# Patient Record
Sex: Female | Born: 1965 | Race: White | Hispanic: No | Marital: Married | Smoking: Former smoker
Health system: Southern US, Community
[De-identification: ages and names within clinical notes are randomized; demographics above are authoritative.]

## PROBLEM LIST (undated history)

## (undated) HISTORY — PX: EAR CYST EXCISION: SHX22

## (undated) HISTORY — PX: UMBILICAL HERNIA REPAIR: SHX196

## (undated) HISTORY — PX: TUBAL LIGATION: SHX77

---

## 2014-03-24 ENCOUNTER — Other Ambulatory Visit: Payer: Self-pay | Admitting: Family Medicine

## 2014-03-24 ENCOUNTER — Ambulatory Visit (INDEPENDENT_AMBULATORY_CARE_PROVIDER_SITE_OTHER): Payer: 59

## 2014-03-24 DIAGNOSIS — M542 Cervicalgia: Secondary | ICD-10-CM

## 2015-03-09 IMAGING — CR DG CERVICAL SPINE COMPLETE 4+V
5 series · 5 of 5 positions shown · non-contrast
Comparison: None.

CLINICAL DATA: Two week history of posterior neck burning sensation
with radicular symptoms and both shoulder regions.

EXAM:
CERVICAL SPINE  4+ VIEWS

[view not recorded (1 of 5)]
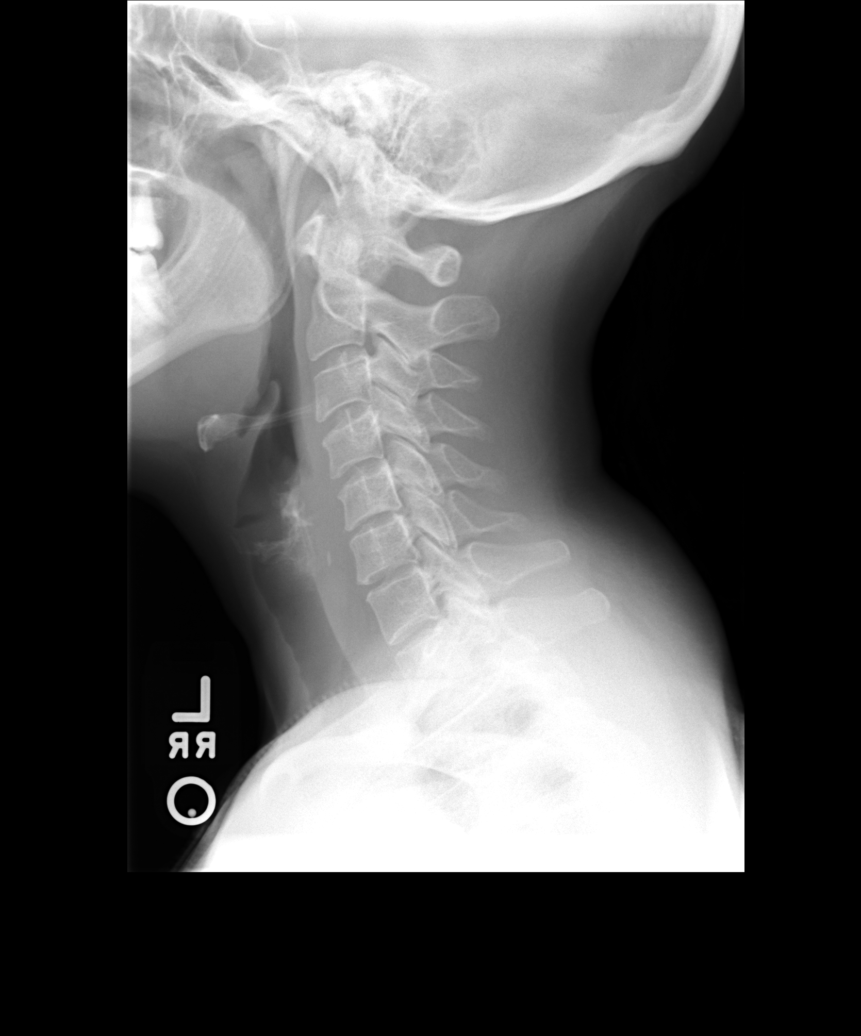

[view not recorded (2 of 5)]
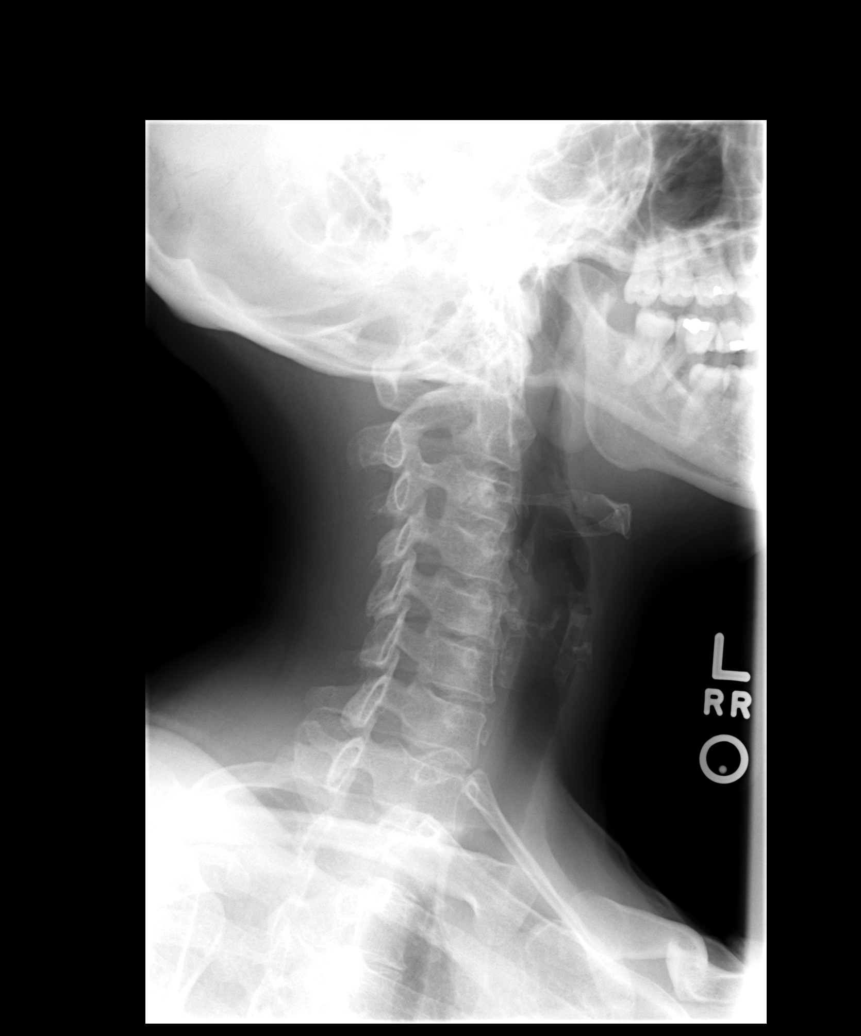

[view not recorded (3 of 5)]
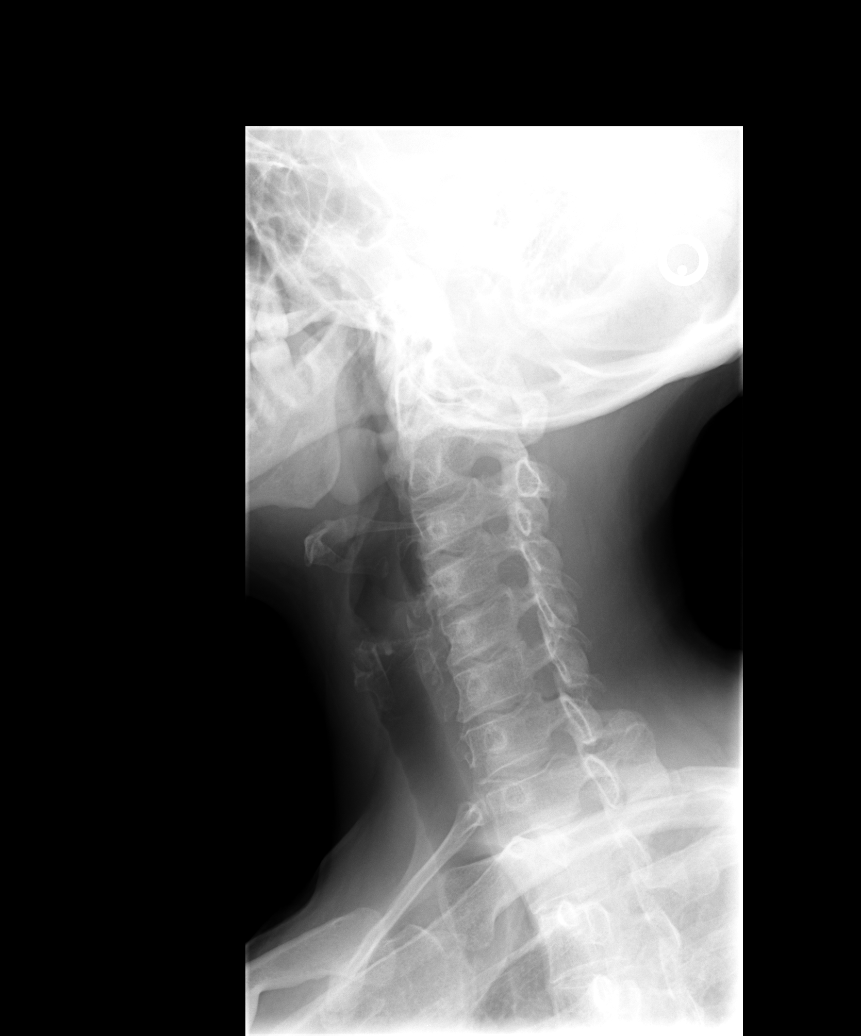

[view not recorded (4 of 5)]
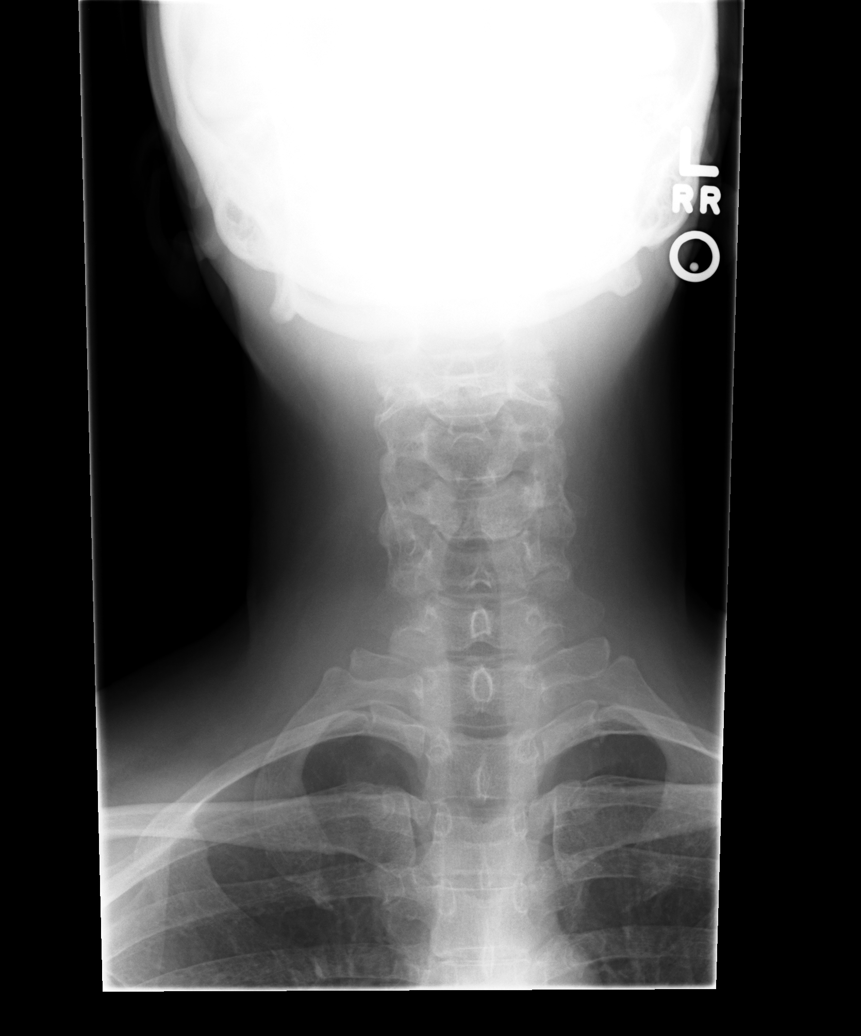

[view not recorded (5 of 5)]
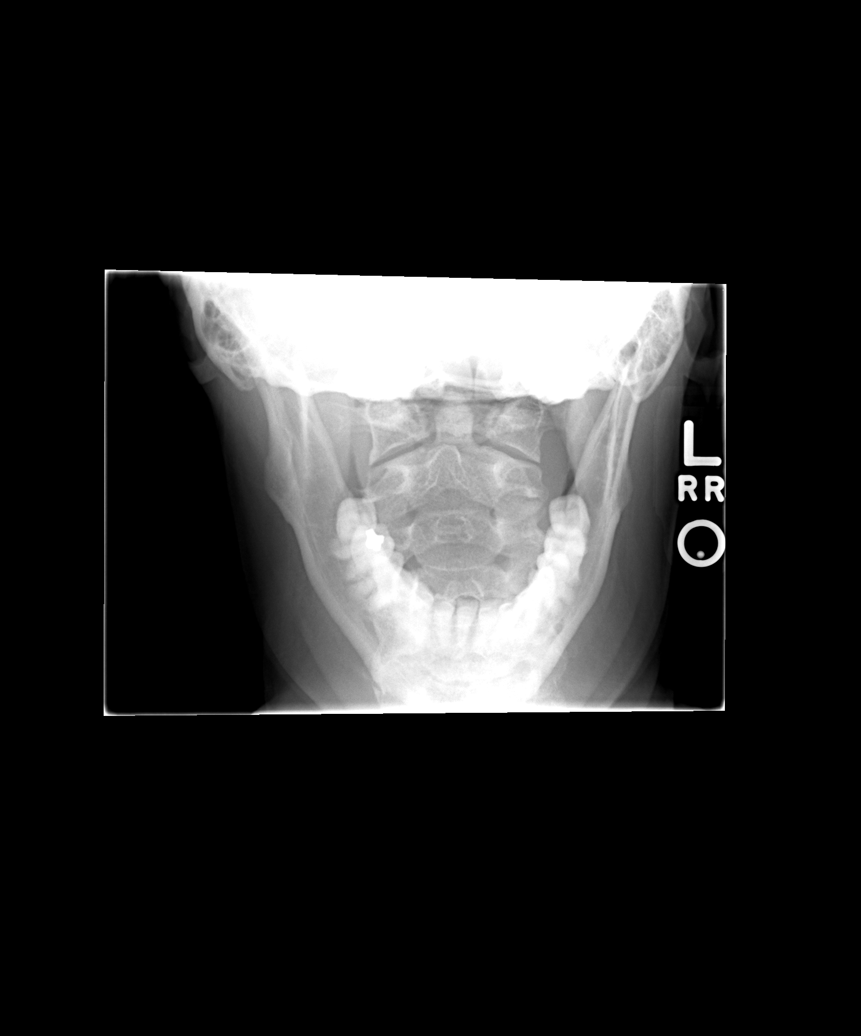

[5 of 5 positions shown; findings below may reference images not displayed]

FINDINGS: Frontal, lateral, open-mouth odontoid, and bilateral oblique views
were obtained. There is no fracture or spondylolisthesis.
Prevertebral soft tissues and predental space regions are normal.
There is moderate disc space narrowing at C5-6 and C6-7. There is
slightly milder narrowing at C4-5. There is no appreciable exit
foraminal narrowing on the oblique views. No erosive change.
IMPRESSION: Osteoarthritic changes several levels. No fracture or
spondylolisthesis.

## 2020-11-10 ENCOUNTER — Encounter: Payer: Self-pay | Admitting: Emergency Medicine

## 2020-11-10 ENCOUNTER — Emergency Department (INDEPENDENT_AMBULATORY_CARE_PROVIDER_SITE_OTHER): Payer: Self-pay

## 2020-11-10 ENCOUNTER — Emergency Department
Admission: EM | Admit: 2020-11-10 | Discharge: 2020-11-10 | Disposition: A | Discharge status: Home / Self Care | Payer: Self-pay | Source: Home / Self Care

## 2020-11-10 ENCOUNTER — Other Ambulatory Visit: Payer: Self-pay

## 2020-11-10 DIAGNOSIS — M25511 Pain in right shoulder: Secondary | ICD-10-CM

## 2020-11-10 DIAGNOSIS — W19XXXA Unspecified fall, initial encounter: Secondary | ICD-10-CM

## 2020-11-10 MED ORDER — MELOXICAM 15 MG PO TABS: 15.0000 mg | ORAL_TABLET | Freq: Every day | ORAL | 0 refills | Status: AC

## 2020-11-10 MED ORDER — ACETAMINOPHEN 325 MG PO TABS
650.0000 mg | ORAL_TABLET | Freq: Once | ORAL | Status: AC
  Administered 2020-11-10: 650 mg via ORAL

## 2020-11-10 NOTE — Discharge Instructions (Addendum)
Take Meloxicam once daily for pain and inflammation.  

## 2020-11-10 NOTE — ED Triage Notes (Addendum)
Walking dog this am - fell onto the concrete when the dog chased a cat  Denies LOC - hit her right shoulder & knee No OTC pain meds - meds & ice refused in triage Able to lift right  arm but has pain & numbness ROM limited with movement to the front

## 2020-11-10 NOTE — ED Provider Notes (Signed)
[facility]  ____________________________________________  Time seen: Approximately 8:29 AM  I have reviewed the triage vital signs and the nursing notes.   HISTORY  Chief Complaint Shoulder Injury (right)   Historian Patient     HPI Melissa Kidd is a 55 y.o. female presents to the urgent care with acute right shoulder pain after mechanical fall.  Patient reports that she was walking her dog this morning and dog started to chase a cat.  Patient fell onto her right shoulder.  She did not hit her head or neck.  She is complaining of a numbness and tingling sensation that extends from her shoulder and stops at the elbow.  No weakness in the upper extremities.  No chest pain, chest tightness or abdominal pain.   History reviewed. No pertinent past medical history.   Immunizations up to date:  Yes.     History reviewed. No pertinent past medical history.  There are no problems to display for this patient.    Prior to Admission medications   Medication Sig Start Date End Date Taking? Authorizing Provider  meloxicam (MOBIC) 15 MG tablet Take 1 tablet (15 mg total) by mouth daily for 7 days. 11/10/20 11/17/20 Yes Orvil Feil, PA-C    Allergies Patient has no known allergies.  Family History  Problem Relation Age of Onset  . Healthy Mother     Social History Social History   Tobacco Use  . Smoking status: Former Smoker    Types: Cigarettes    Quit date: 1992    Years since quitting: 30.4  . Smokeless tobacco: Never Used  Vaping Use  . Vaping Use: Never used  Substance Use Topics  . Alcohol use: Yes    Alcohol/week: 3.0 standard drinks    Types: 3 Glasses of wine per week  . Drug use: Never     Review of Systems  Constitutional: No fever/chills Eyes:  No discharge ENT: No upper respiratory complaints. Respiratory: no cough. No SOB/ use of accessory muscles to breath Gastrointestinal:   No nausea, no vomiting.  No diarrhea.  No  constipation. Musculoskeletal: Patient has right shoulder pain.  Skin: Negative for rash, abrasions, lacerations, ecchymosis.    ____________________________________________   PHYSICAL EXAM:  VITAL SIGNS: ED Triage Vitals  Enc Vitals Group     BP 11/10/20 0822 132/85     Pulse Rate 11/10/20 0822 (!) 59     Resp 11/10/20 0822 15     Temp 11/10/20 0822 98.6 F (37 C)     Temp src --      SpO2 11/10/20 0822 98 %     Weight 11/10/20 0824 130 lb (59 kg)     Height 11/10/20 0824 5' (1.524 m)     Head Circumference --      Peak Flow --      Pain Score 11/10/20 0824 5     Pain Loc --      Pain Edu? --      Excl. in GC? --      Constitutional: Alert and oriented. Well appearing and in no acute distress. Eyes: Conjunctivae are normal. PERRL. EOMI. Head: Atraumatic. ENT:      Nose: No congestion/rhinnorhea.      Mouth/Throat: Mucous membranes are moist.  Neck: No stridor.  No cervical spine tenderness to palpation. Cardiovascular: Normal rate, regular rhythm. Normal S1 and S2.  Good peripheral circulation. Respiratory: Normal respiratory effort without tachypnea or retractions. Lungs CTAB. Good air entry to the bases  with no decreased or absent breath sounds Gastrointestinal: Bowel sounds x 4 quadrants. Soft and nontender to palpation. No guarding or rigidity. No distention. Musculoskeletal: Patient has symmetric strength in the upper extremities.  Patient has pain with right rotator cuff testing but no weakness.  Palpable radial and ulnar pulses bilaterally and symmetrically.  Capillary refill less than 2 seconds on the right. Neurologic:  Normal for age. No gross focal neurologic deficits are appreciated.  Skin:  Skin is warm, dry and intact. No rash noted. Psychiatric: Mood and affect are normal for age. Speech and behavior are normal.   ____________________________________________   LABS (all labs ordered are listed, but only abnormal results are displayed)  Labs Reviewed  - No data to display ____________________________________________  EKG   ____________________________________________  RADIOLOGY Geraldo Pitter, personally viewed and evaluated these images (plain radiographs) as part of my medical decision making, as well as reviewing the written report by the radiologist.    DG Shoulder Right  Result Date: 11/10/2020 CLINICAL DATA:  Fall. EXAM: RIGHT SHOULDER - 2+ VIEW COMPARISON:  No prior. FINDINGS: No acute bony or joint abnormality identified. No evidence of fracture, dislocation, or separation. IMPRESSION: No acute abnormality. Electronically Signed   By: Maisie Fus  Register   On: 11/10/2020 08:57    ____________________________________________    PROCEDURES  Procedure(s) performed:     Procedures     Medications  acetaminophen (TYLENOL) tablet 650 mg (650 mg Oral Given 11/10/20 0832)     ____________________________________________   INITIAL IMPRESSION / ASSESSMENT AND PLAN / ED COURSE  Pertinent labs & imaging results that were available during my care of the patient were reviewed by me and considered in my medical decision making (see chart for details).      Assessment and plan: Right shoulder pain:  55 year old female presents to the urgent care with acute right shoulder pain after mechanical fall.  No bony abnormality was visualized on x-ray of the right shoulder.  She had no right rotator cuff weakness with exam.  Patient was placed in a sling for comfort and was prescribed meloxicam.  Return precautions were given to return with new or worsening symptoms.    ____________________________________________  FINAL CLINICAL IMPRESSION(S) / ED DIAGNOSES  Final diagnoses:  Pain in joint of right shoulder      NEW MEDICATIONS STARTED DURING THIS VISIT:  ED Discharge Orders         Ordered    meloxicam (MOBIC) 15 MG tablet  Daily        11/10/20 0915              This chart was dictated using voice  recognition software/Dragon. Despite best efforts to proofread, errors can occur which can change the meaning. Any change was purely unintentional.     Orvil Feil, [place] 11/10/20 [phone]

## 2021-10-26 IMAGING — DX DG SHOULDER 2+V*R*
3 series · 3 of 3 positions shown · non-contrast
Comparison: No prior.

CLINICAL DATA: Fall.

EXAM:
RIGHT SHOULDER - 2+ VIEW

[shoulder grashey]
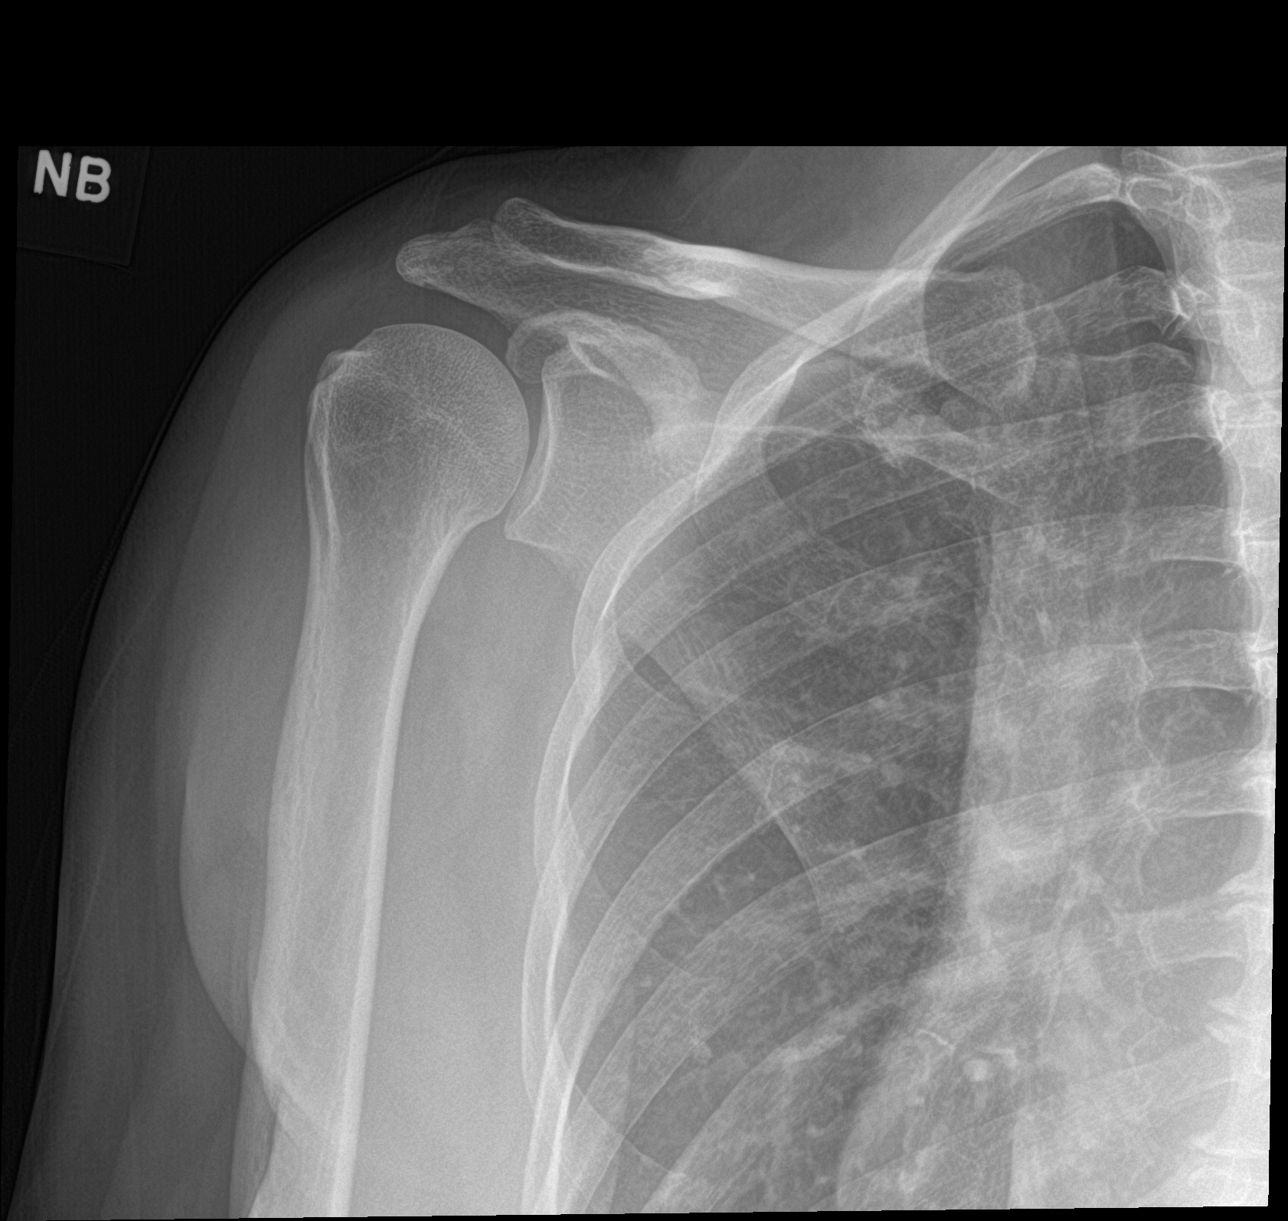

[shoulder y view]
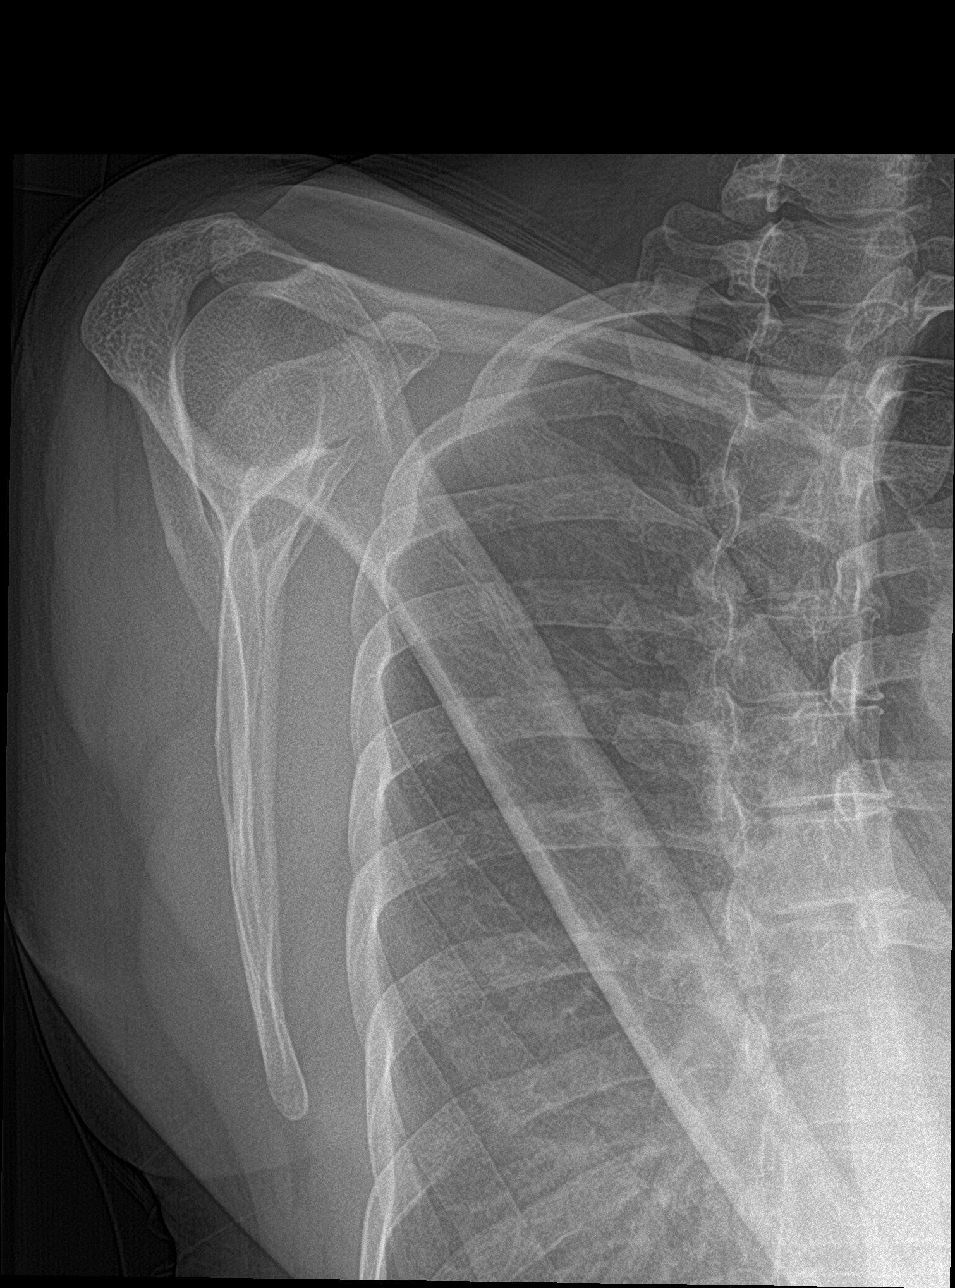

[shoulder axillary]
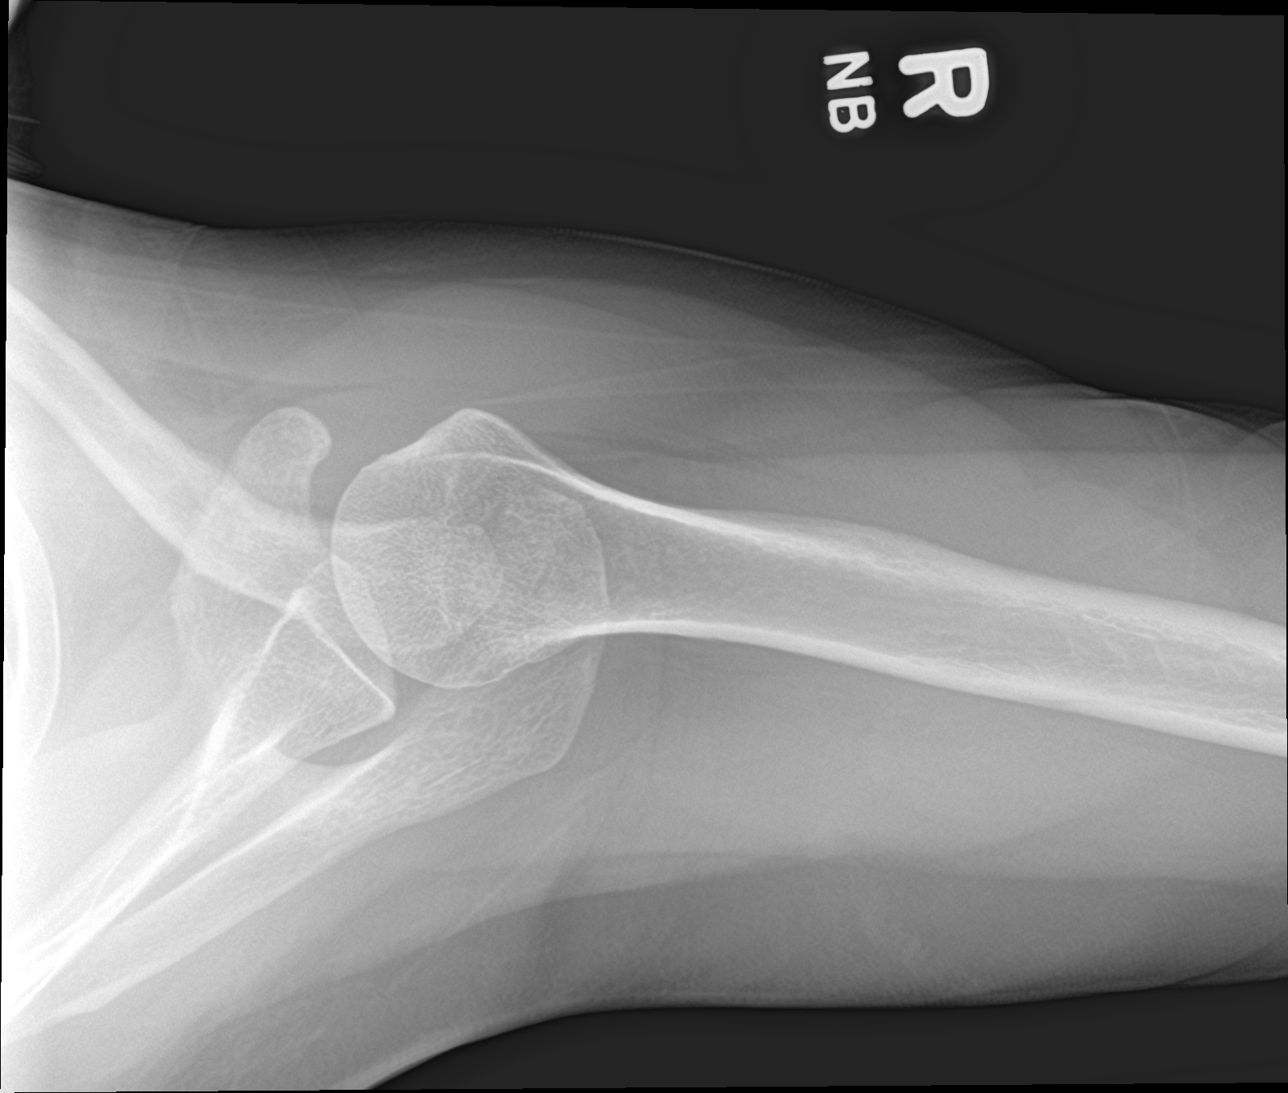

[3 of 3 positions shown; findings below may reference images not displayed]

FINDINGS: No acute bony or joint abnormality identified. No evidence of
fracture, dislocation, or separation.
IMPRESSION: No acute abnormality.
